# Patient Record
Sex: Male | Born: 2005 | Race: White | Hispanic: No | Marital: Single | Smoking: Never smoker
Health system: Southern US, Community
[De-identification: ages and names within clinical notes are randomized; demographics above are authoritative.]

---
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
## 2023-01-14 ENCOUNTER — Ambulatory Visit: Payer: BC Managed Care – PPO | Admitting: Dermatology

## 2023-01-21 ENCOUNTER — Ambulatory Visit: Payer: BC Managed Care – PPO | Admitting: Dermatology

## 2023-05-06 ENCOUNTER — Ambulatory Visit: Payer: BC Managed Care – PPO | Admitting: Dermatology

## 2023-05-20 ENCOUNTER — Ambulatory Visit: Payer: BC Managed Care – PPO | Admitting: Dermatology

## 2023-05-20 DIAGNOSIS — L814 Other melanin hyperpigmentation: Secondary | ICD-10-CM

## 2023-05-20 DIAGNOSIS — D229 Melanocytic nevi, unspecified: Secondary | ICD-10-CM

## 2023-05-20 DIAGNOSIS — L578 Other skin changes due to chronic exposure to nonionizing radiation: Secondary | ICD-10-CM

## 2023-05-20 DIAGNOSIS — L821 Other seborrheic keratosis: Secondary | ICD-10-CM | POA: Diagnosis not present

## 2023-05-20 DIAGNOSIS — Z1283 Encounter for screening for malignant neoplasm of skin: Secondary | ICD-10-CM | POA: Diagnosis not present

## 2023-05-20 DIAGNOSIS — D1801 Hemangioma of skin and subcutaneous tissue: Secondary | ICD-10-CM | POA: Diagnosis not present

## 2023-05-20 DIAGNOSIS — L700 Acne vulgaris: Secondary | ICD-10-CM

## 2023-05-20 DIAGNOSIS — Z86018 Personal history of other benign neoplasm: Secondary | ICD-10-CM

## 2023-05-20 DIAGNOSIS — Z7189 Other specified counseling: Secondary | ICD-10-CM

## 2023-05-20 NOTE — Patient Instructions (Signed)

## 2023-05-20 NOTE — Progress Notes (Signed)
   Follow-Up Visit   Subjective  Jacob Rivers is a 17 y.o. male who presents for the following: Skin Cancer Screening and Full Body Skin Exam, hx of Dysplastic nevus. Check acne on his face, patient still unsure if he would like to start Isotretinoin for acne, he was prescribed Isotretinoin at his last visit 5 months ago.   The patient presents for Total-Body Skin Exam (TBSE) for skin cancer screening and mole check. The patient has spots, moles and lesions to be evaluated, some may be new or changing and the patient may have concern these could be cancer.    The following portions of the chart were reviewed this encounter and updated as appropriate: medications, allergies, medical history  Review of Systems:  No other skin or systemic complaints except as noted in HPI or Assessment and Plan.  Objective  Well appearing patient in no apparent distress; mood and affect are within normal limits.  A full examination was performed including scalp, head, eyes, ears, nose, lips, neck, chest, axillae, abdomen, back, buttocks, bilateral upper extremities, bilateral lower extremities, hands, feet, fingers, toes, fingernails, and toenails. All findings within normal limits unless otherwise noted below.   Relevant physical exam findings are noted in the Assessment and Plan.    Assessment & Plan   SKIN CANCER SCREENING PERFORMED TODAY.   LENTIGINES, SEBORRHEIC KERATOSES, HEMANGIOMAS - Benign normal skin lesions - Benign-appearing - Call for any changes  MELANOCYTIC NEVI - Tan-brown and/or pink-flesh-colored symmetric macules and papules - Benign appearing on exam today - Observation - Call clinic for new or changing moles - Recommend daily use of broad spectrum spf 30+ sunscreen to sun-exposed areas.   ACNE VULGARIS Exam: mod inflamed comedones and mod papules scattered over the face   Chronic and persistent condition with duration or expected duration over one year. Condition is  symptomatic/ bothersome to patient. Not currently at goal.   Treatment Plan: Patient instructed to call here with in the next 30 days and let us know if he would like to start Isotretinoin   Labs reviewed from 12/24/22 WNL   While taking isotretinoin, do not share pills and do not donate blood. Generic isotretinoin is best absorbed when taken with a fatty meal. Isotretinoin can make you sensitive to the sun. Daily careful sun protection including sunscreen SPF 30+ when outdoors is recommended.   If patient start Isotretinoin he will need to follow up  30 days after starting Isotretinoin.   HISTORY OF DYSPLASTIC NEVUS Right mid back 2018 No evidence of recurrence today Recommend regular full body skin exams Recommend daily broad spectrum sunscreen SPF 30+ to sun-exposed areas, reapply every 2 hours as needed.  Call if any new or changing lesions are noted between office visits     Return in about 1 year (around 05/19/2024) for 12 months for TBSE.  IAngelique Holm, CMA, am acting as scribe for Armida Sans, MD .   Documentation: I have reviewed the above documentation for accuracy and completeness, and I agree with the above.  Armida Sans, MD

## 2023-05-28 ENCOUNTER — Encounter: Payer: Self-pay | Admitting: Dermatology

## 2023-07-16 ENCOUNTER — Telehealth: Payer: Self-pay

## 2023-07-16 MED ORDER — DOXYCYCLINE HYCLATE 50 MG PO TABS: 3.0000 | ORAL_TABLET | Freq: Every day | ORAL | 5 refills | Status: AC

## 2023-07-16 MED ORDER — TAZAROTENE 0.1 % EX FOAM: CUTANEOUS | 5 refills | Status: AC

## 2023-07-16 NOTE — Telephone Encounter (Signed)
Patient mom calling requesting to re start acne medications Doxycycline tablets and Fabior foam, patient will not be starting Isotretinoin as was discussed at last office visit.

## 2023-07-27 ENCOUNTER — Ambulatory Visit: Payer: BC Managed Care – PPO | Admitting: Dermatology

## 2023-08-02 ENCOUNTER — Ambulatory Visit
Admission: RE | Admit: 2023-08-02 | Discharge: 2023-08-02 | Disposition: A | Discharge status: Home / Self Care | Payer: BC Managed Care – PPO | Source: Ambulatory Visit

## 2023-08-02 VITALS — BP 111/69 | HR 69 | Temp 98.8°F | Resp 18

## 2023-08-02 DIAGNOSIS — J302 Other seasonal allergic rhinitis: Secondary | ICD-10-CM | POA: Diagnosis not present

## 2023-08-02 DIAGNOSIS — R519 Headache, unspecified: Secondary | ICD-10-CM | POA: Diagnosis not present

## 2023-08-02 NOTE — ED Triage Notes (Signed)
Patient to Urgent Care with complaints of headache. Reports most of his pain is behind his left eye/ left side of his head. Concerned about a sinus infection. No fevers.   Symptoms started Wednesday. Taking tylenol/ Advil/ Pedialyte with some relief.

## 2023-08-02 NOTE — ED Provider Notes (Signed)
Jacob Rivers    CSN: [id number] Arrival date & time: 08/02/23  1159      History   Chief Complaint Chief Complaint  Patient presents with   Headache    I think I might have a sinus infection. I have had a bad headache for a few days. - Entered by patient    HPI Jacob Rivers is a 17 y.o. male.  Patient presents with headache x 3 to 4 days.  His headache resolved yesterday after taking Tylenol, ibuprofen, Pedialyte.  No headache at this time.  He reports nasal congestion which occurs seasonally due to allergies.  No fever, ear pain, sore throat, cough, shortness of breath, or other symptoms.  No OTC medications taken today.  The history is provided by the patient and medical records.    Past Medical History:  Diagnosis Date   Hx of dysplastic nevus 01/02/2017   R mid back,mild atypia    There are no problems to display for this patient.   History reviewed. No pertinent surgical history.     Home Medications    Prior to Admission medications   Medication Sig Start Date End Date Taking? Authorizing Provider  cetirizine (ZYRTEC) 10 MG chewable tablet Chew by mouth.    [provider]  Doxycycline Hyclate 50 MG TABS Take 3 tablets by mouth daily. 07/16/23   Deirdre Evener, MD  IBUPROFEN PO Take by mouth. Patient not taking: Reported on 12/03/2022    [provider]  ISOtretinoin (ABSORICA) 20 MG capsule Take 1 capsule (20 mg total) by mouth daily. Patient not taking: Reported on 08/02/2023 12/25/22   Deirdre Evener, MD  ondansetron (ZOFRAN ODT) 4 MG disintegrating tablet Take 1 tablet (4 mg total) by mouth every 8 (eight) hours as needed for nausea or vomiting. Patient not taking: Reported on 12/03/2022 09/04/18   Merrily Brittle, MD  Tazarotene [place]) 0.1 % FOAM Apply to face at bedtime, wash off in morning 07/16/23   Deirdre Evener, MD    Family History History reviewed. No pertinent family history.  Social History Social History    Tobacco Use   Smoking status: Never   Smokeless tobacco: Never  Substance Use Topics   Alcohol use: No   Drug use: No     Allergies   Patient has no known allergies.   Review of Systems Review of Systems  Constitutional:  Negative for chills and fever.  HENT:  Positive for congestion and rhinorrhea. Negative for ear pain and sore throat.   Respiratory:  Negative for cough and shortness of breath.   Neurological:  Positive for headaches. Negative for dizziness, syncope, weakness and numbness.     Physical Exam Triage Vital Signs ED Triage Vitals [08/02/23 1210]  Encounter Vitals Group     BP 111/69     Systolic BP Percentile      Diastolic BP Percentile      Pulse Rate 69     Resp 18     Temp 98.8 F (37.1 C)     Temp src      SpO2 98 %     Weight      Height      Head Circumference      Peak Flow      Pain Score 0     Pain Loc      Pain Education      Exclude from Growth Chart    No data found.  Updated Vital Signs  BP 111/69   Pulse 69   Temp 98.8 F (37.1 C)   Resp 18   SpO2 98%   Visual Acuity Right Eye Distance:   Left Eye Distance:   Bilateral Distance:    Right Eye Near:   Left Eye Near:    Bilateral Near:     Physical Exam Constitutional:      General: He is not in acute distress. HENT:     Right Ear: Tympanic membrane normal.     Left Ear: Tympanic membrane normal.     Nose: Rhinorrhea present.     Mouth/Throat:     Mouth: Mucous membranes are moist.     Pharynx: Oropharynx is clear.  Cardiovascular:     Rate and Rhythm: Normal rate and regular rhythm.     Heart sounds: Normal heart sounds.  Pulmonary:     Effort: Pulmonary effort is normal. No respiratory distress.     Breath sounds: Normal breath sounds.  Skin:    General: Skin is warm and dry.  Neurological:     Mental Status: He is alert.      UC Treatments / Results  Labs (all labs ordered are listed, but only abnormal results are displayed) Labs Reviewed - No  data to display  EKG   Radiology No results found.  Procedures Procedures (including critical care time)  Medications Ordered in UC Medications - No data to display  Initial Impression / Assessment and Plan / UC Course  I have reviewed the triage vital signs and the nursing notes.  Pertinent labs & imaging results that were available during my care of the patient were reviewed by me and considered in my medical decision making (see chart for details).    Headache, seasonal allergies.  Afebrile and vital signs are stable.  Discussed symptomatic management of congestion, including Zyrtec, plain Mucinex, Flonase nasal spray.  1 hour ibuprofen as needed.  Instructed patient to follow-up with his PCP.  Education provided on headache and allergic rhinitis.  He agrees to plan of care.  Final Clinical Impressions(s) / UC Diagnoses   Final diagnoses:  Acute nonintractable headache, unspecified headache type  Seasonal allergies   Discharge Instructions   None    ED Prescriptions   None    PDMP not reviewed this encounter.   Mickie Bail, NP 08/02/23 1244

## 2023-09-09 ENCOUNTER — Telehealth: Payer: Self-pay

## 2023-09-09 NOTE — Telephone Encounter (Signed)
Patient's mother left voicemail regarding Benzoyl Peroxide. She wanted to know your opinion on safety due to recent recalls and articles related to carcinogenic issues. Should patient continue using?

## 2023-09-10 NOTE — Telephone Encounter (Signed)
Left detailed voicemail with information per Dr. Nehemiah Massed. aw ?

## 2023-10-01 ENCOUNTER — Telehealth: Payer: Self-pay

## 2023-10-01 NOTE — Telephone Encounter (Signed)
 Pt's mother left a voicemail that patient needed proof of prescribed medications since he is traveling out of the [place] and will need them at the airport to get through customs. I left voicemail for patient to return my call. Proof of prescriptions printed and left at front desk check in. Mother not listed on [employer] and he will need to fill out [employer] form for us  to speak with her.

## 2024-06-01 ENCOUNTER — Ambulatory Visit (INDEPENDENT_AMBULATORY_CARE_PROVIDER_SITE_OTHER): Payer: BC Managed Care – PPO | Admitting: Dermatology

## 2024-06-01 DIAGNOSIS — D229 Melanocytic nevi, unspecified: Secondary | ICD-10-CM

## 2024-06-01 DIAGNOSIS — Q825 Congenital non-neoplastic nevus: Secondary | ICD-10-CM

## 2024-06-01 DIAGNOSIS — L906 Striae atrophicae: Secondary | ICD-10-CM

## 2024-06-01 DIAGNOSIS — Z7189 Other specified counseling: Secondary | ICD-10-CM

## 2024-06-01 DIAGNOSIS — L700 Acne vulgaris: Secondary | ICD-10-CM

## 2024-06-01 DIAGNOSIS — L814 Other melanin hyperpigmentation: Secondary | ICD-10-CM

## 2024-06-01 DIAGNOSIS — L578 Other skin changes due to chronic exposure to nonionizing radiation: Secondary | ICD-10-CM

## 2024-06-01 DIAGNOSIS — Z1283 Encounter for screening for malignant neoplasm of skin: Secondary | ICD-10-CM

## 2024-06-01 DIAGNOSIS — W908XXA Exposure to other nonionizing radiation, initial encounter: Secondary | ICD-10-CM

## 2024-06-01 DIAGNOSIS — Z86018 Personal history of other benign neoplasm: Secondary | ICD-10-CM

## 2024-06-01 NOTE — Progress Notes (Unsigned)
 Follow-Up Visit   Subjective  Jacob Rivers is a 18 y.o. male who presents for the following: Skin Cancer Screening and Full Body Skin Exam  The patient presents for Total-Body Skin Exam (TBSE) for skin cancer screening and mole check. The patient has spots, moles and lesions to be evaluated, some may be new or changing and the patient may have concern these could be cancer.  The following portions of the chart were reviewed this encounter and updated as appropriate: medications, allergies, medical history  Review of Systems:  No other skin or systemic complaints except as noted in HPI or Assessment and Plan.  Objective  Well appearing patient in no apparent distress; mood and affect are within normal limits.  A full examination was performed including scalp, head, eyes, ears, nose, lips, neck, chest, axillae, abdomen, back, buttocks, bilateral upper extremities, bilateral lower extremities, hands, feet, fingers, toes, fingernails, and toenails. All findings within normal limits unless otherwise noted below.   Relevant physical exam findings are noted in the Assessment and Plan.    Assessment & Plan   SKIN CANCER SCREENING PERFORMED TODAY.  ACTINIC DAMAGE - Chronic condition, secondary to cumulative UV/sun exposure - diffuse scaly erythematous macules with underlying dyspigmentation - Recommend daily broad spectrum sunscreen SPF 30+ to sun-exposed areas, reapply every 2 hours as needed.  - Staying in the shade or wearing long sleeves, sun glasses (UVA+UVB protection) and wide brim hats (4-inch brim around the entire circumference of the hat) are also recommended for sun protection.  - Call for new or changing lesions.  LENTIGINES, SEBORRHEIC KERATOSES, HEMANGIOMAS - Benign normal skin lesions - Benign-appearing - Call for any changes  MELANOCYTIC NEVI - Tan-brown and/or pink-flesh-colored symmetric macules and papules - Benign appearing on exam today - Observation - Call  clinic for new or changing moles - Recommend daily use of broad spectrum spf 30+ sunscreen to sun-exposed areas.   HISTORY OF DYSPLASTIC NEVUS - R mid back, mild, 01/02/2017 No evidence of recurrence today Recommend regular full body skin exams Recommend daily broad spectrum sunscreen SPF 30+ to sun-exposed areas, reapply every 2 hours as needed.  Call if any new or changing lesions are noted between office visits  ACNE VULGARIS Exam: milia, scarring of the cheeks and zygomas  Chronic condition with duration or expected duration over one year. Currently well-controlled.  Treatment Plan: Continue Differin 0.1% gel QHS. Topical retinoid medications like tretinoin /Retin-A , adapalene/Differin, tazarotene /Fabior , and Epiduo/Epiduo Forte can cause dryness and irritation when first started. Only apply a pea-sized amount to the entire affected area. Avoid applying it around the eyes, edges of mouth and creases at the nose. If you experience irritation, use a good moisturizer first and/or apply the medicine less often. If you are doing well with the medicine, you can increase how often you use it until you are applying every night. Be careful with sun protection while using this medication as it can make you sensitive to the sun. This medicine should not be used by pregnant women.  Striae of the back Discussed striae, their meaning, their fading over time. Treatments not known to help except to sooner turn down red color intensity. 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  Observation.  Call clinic for new or changing lesions.  Recommend daily use of broad spectrum spf 30+ sunscreen to sun-exposed areas.    No follow-ups on file.  Jacob Rivers,  CMA, am acting as scribe for Alm Rhyme, MD .   Documentation: I have reviewed the above documentation for accuracy and completeness, and I agree with the  above.  Alm Rhyme, MD

## 2024-06-01 NOTE — Patient Instructions (Signed)

## 2024-06-02 ENCOUNTER — Encounter: Payer: Self-pay | Admitting: Dermatology
